# Patient Record
Sex: Male | Born: 1955 | Race: White | Hispanic: No | Marital: Married | Smoking: Never smoker
Health system: Southern US, Community
[De-identification: ages and names within clinical notes are randomized; demographics above are authoritative.]

## PROBLEM LIST (undated history)

## (undated) DIAGNOSIS — I82409 Acute embolism and thrombosis of unspecified deep veins of unspecified lower extremity: Secondary | ICD-10-CM

## (undated) DIAGNOSIS — E079 Disorder of thyroid, unspecified: Secondary | ICD-10-CM

## (undated) DIAGNOSIS — I2510 Atherosclerotic heart disease of native coronary artery without angina pectoris: Secondary | ICD-10-CM

## (undated) DIAGNOSIS — N529 Male erectile dysfunction, unspecified: Secondary | ICD-10-CM

## (undated) HISTORY — PX: NASAL SINUS SURGERY: SHX719

---

## 2004-11-27 ENCOUNTER — Ambulatory Visit: Payer: Self-pay | Admitting: Unknown Physician Specialty

## 2009-10-08 ENCOUNTER — Ambulatory Visit: Payer: Self-pay | Admitting: Unknown Physician Specialty

## 2010-04-02 ENCOUNTER — Ambulatory Visit: Payer: Self-pay | Admitting: Internal Medicine

## 2010-04-10 ENCOUNTER — Ambulatory Visit: Payer: Self-pay | Admitting: General Practice

## 2010-04-17 ENCOUNTER — Ambulatory Visit: Payer: Self-pay | Admitting: Urology

## 2010-04-25 ENCOUNTER — Ambulatory Visit: Payer: Self-pay | Admitting: General Practice

## 2010-08-21 ENCOUNTER — Ambulatory Visit: Payer: Self-pay | Admitting: Urology

## 2011-09-28 ENCOUNTER — Ambulatory Visit: Payer: Self-pay | Admitting: Urology

## 2017-01-08 ENCOUNTER — Other Ambulatory Visit: Payer: Self-pay | Admitting: Physician Assistant

## 2017-01-08 ENCOUNTER — Ambulatory Visit
Admission: RE | Admit: 2017-01-08 | Discharge: 2017-01-08 | Disposition: A | Discharge status: Home / Self Care | Payer: Managed Care, Other (non HMO) | Source: Ambulatory Visit | Attending: Physician Assistant | Admitting: Physician Assistant

## 2017-01-08 DIAGNOSIS — M79605 Pain in left leg: Secondary | ICD-10-CM

## 2017-01-08 DIAGNOSIS — I82812 Embolism and thrombosis of superficial veins of left lower extremities: Secondary | ICD-10-CM | POA: Diagnosis not present

## 2017-07-16 ENCOUNTER — Other Ambulatory Visit: Payer: Self-pay | Admitting: Physician Assistant

## 2017-07-16 ENCOUNTER — Ambulatory Visit
Admission: RE | Admit: 2017-07-16 | Discharge: 2017-07-16 | Disposition: A | Discharge status: Home / Self Care | Payer: Managed Care, Other (non HMO) | Source: Ambulatory Visit | Attending: Physician Assistant | Admitting: Physician Assistant

## 2017-07-16 ENCOUNTER — Emergency Department
Admission: EM | Admit: 2017-07-16 | Discharge: 2017-07-16 | Disposition: A | Discharge status: Home / Self Care | Payer: Managed Care, Other (non HMO) | Attending: Emergency Medicine | Admitting: Emergency Medicine

## 2017-07-16 DIAGNOSIS — I82412 Acute embolism and thrombosis of left femoral vein: Secondary | ICD-10-CM

## 2017-07-16 DIAGNOSIS — M7989 Other specified soft tissue disorders: Secondary | ICD-10-CM

## 2017-07-16 DIAGNOSIS — I82432 Acute embolism and thrombosis of left popliteal vein: Secondary | ICD-10-CM

## 2017-07-16 DIAGNOSIS — Z86718 Personal history of other venous thrombosis and embolism: Secondary | ICD-10-CM

## 2017-07-16 DIAGNOSIS — I82442 Acute embolism and thrombosis of left tibial vein: Secondary | ICD-10-CM

## 2017-07-16 DIAGNOSIS — R2242 Localized swelling, mass and lump, left lower limb: Secondary | ICD-10-CM | POA: Diagnosis present

## 2017-07-16 DIAGNOSIS — I82812 Embolism and thrombosis of superficial veins of left lower extremities: Secondary | ICD-10-CM

## 2017-07-16 HISTORY — DX: Acute embolism and thrombosis of unspecified deep veins of unspecified lower extremity: I82.409

## 2017-07-16 LAB — BASIC METABOLIC PANEL
Anion gap: 10 (ref 5–15)
BUN: 18 mg/dL (ref 6–20)
CALCIUM: 9 mg/dL (ref 8.9–10.3)
CHLORIDE: 103 mmol/L (ref 101–111)
CO2: 22 mmol/L (ref 22–32)
CREATININE: 0.87 mg/dL (ref 0.61–1.24)
GFR calc Af Amer: >60 mL/min (ref >60)
GFR calc non Af Amer: >60 mL/min (ref >60)
Glucose, Bld: 111 mg/dL — ABNORMAL HIGH (ref 65–99)
Potassium: 3.7 mmol/L (ref 3.5–5.1)
SODIUM: 135 mmol/L (ref 135–145)

## 2017-07-16 LAB — PROTIME-INR
INR: 1.09
Prothrombin Time: 14 seconds (ref 11.4–15.2)

## 2017-07-16 LAB — APTT: aPTT: 25 seconds (ref 24–36)

## 2017-07-16 LAB — CBC
HEMATOCRIT: 38 % — AB (ref 40.0–52.0)
HEMOGLOBIN: 13.3 g/dL (ref 13.0–18.0)
MCH: 30.6 pg (ref 26.0–34.0)
MCHC: 34.9 g/dL (ref 32.0–36.0)
MCV: 87.7 fL (ref 80.0–100.0)
Platelets: 207 10*3/uL (ref 150–440)
RBC: 4.34 MIL/uL — ABNORMAL LOW (ref 4.40–5.90)
RDW: 13.3 % (ref 11.5–14.5)
WBC: 9.2 10*3/uL (ref 3.8–10.6)

## 2017-07-16 MED ORDER — ELIQUIS 5 MG VTE STARTER PACK: ORAL_TABLET | ORAL | 0 refills | Status: DC

## 2017-07-16 MED ORDER — APIXABAN 5 MG PO TABS
10.0000 mg | ORAL_TABLET | Freq: Once | ORAL | Status: AC
  Administered 2017-07-16: 10 mg via ORAL
  Filled 2017-07-16: qty 2

## 2017-07-16 MED ORDER — ELIQUIS 5 MG VTE STARTER PACK: ORAL_TABLET | ORAL | 0 refills | Status: AC

## 2017-07-16 MED ORDER — APIXABAN 5 MG PO TABS: 10.0000 mg | ORAL_TABLET | Freq: Two times a day (BID) | ORAL | 0 refills | Status: DC

## 2017-07-16 NOTE — Discharge Instructions (Signed)
Please read through these written instructions thoroughly. Make an appointment with your primary care physician.  Return to the emergency department if you develop chest pain, shortness of breath, fever, bloody or black stools, lightheadedness or fainting, or any other symptoms concerning to you.

## 2017-07-16 NOTE — ED Notes (Signed)
FIRST NURSE NOTE:  Here from radiology, pt has occluded popliteal vein. Pt placed in wheelchair on arrival, denies any chest pain or shortness of breath.

## 2017-07-16 NOTE — ED Provider Notes (Signed)
[facility] Emergency Department Provider Note  ____________________________________________  Time seen: Approximately 6:56 PM  I have reviewed the triage vital signs and the nursing notes.   HISTORY  Chief Complaint DVT    HPI Hayden Fletcher is a 61 y.o. male with a history of DVT on full dose aspirin presenting with left lower extremity swelling, erythema, pain,and an outside DVT study positive for blood clot. The patient denies any chest pain, shortness of breath, lightheadedness or syncope.  He has not had any black, tarry or bloody stools.  No hx of GI bleed.   Past Medical History:  Diagnosis Date  . DVT (deep venous thrombosis) ([place])     There are no active problems to display for this patient.   Past Surgical History:  Procedure Laterality Date  . NASAL SINUS SURGERY        Allergies Patient has no known allergies.  History reviewed. No pertinent family history.  Social History Social History  Substance Use Topics  . Smoking status: Never Smoker  . Smokeless tobacco: Not on file  . Alcohol use Yes    Review of Systems Constitutional: No fever/chills. No lightheadedness or syncope. ENT: No congestion or rhinorrhea. Cardiovascular: Denies chest pain. Denies palpitations. Respiratory: Denies shortness of breath.  No cough. Gastrointestinal: No abdominal pain.  No nausea, no vomiting.  No diarrhea.  No constipation. No black tarry or bloody stools. Genitourinary: No hematuria. Musculoskeletal: Negative for back pain. Skin: Negative for rash. Neurological: Negative for headaches. No focal numbness, tingling or weakness.  Hematological/Lymphatic: positive left lower extremity DVT.    ____________________________________________   PHYSICAL EXAM:  VITAL SIGNS: ED Triage Vitals  Enc Vitals Group     BP 07/16/17 1814 137/82     Pulse Rate 07/16/17 1814 95     Resp 07/16/17 1814 18     Temp 07/16/17 1814 99.3 F (37.4 C)   Temp Source 07/16/17 1814 Oral     SpO2 07/16/17 1814 97 %     Weight 07/16/17 1817 210 lb (95.3 kg)     Height 07/16/17 1817 5\' 10"  (1.778 m)     Head Circumference --      Peak Flow --      Pain Score 07/16/17 1813 3     Pain Loc --      Pain Edu? --      Excl. in [place]? --     Constitutional: Alert and oriented. Well appearing and in no acute distress. Answers questions appropriately. Eyes: Conjunctivae are normal.  EOMI. No scleral icterus. Head: Atraumatic. Nose: No congestion/rhinnorhea. Mouth/Throat: Mucous membranes are moist.  Neck: No stridor.  Supple.   Cardiovascular: Normal rate, regular rhythm. No murmurs, rubs or gallops.  Respiratory: Normal respiratory effort.  No accessory muscle use or retractions. Lungs CTAB.  No wheezes, rales or ronchi. Musculoskeletal: Positive right lower extremity swelling with erythema.  Normal DP and PT pulse.  No calf ttp or palpable cords.  5/5 dorsiflexion and plantar flexion. Neurologic:  A&Ox3.  Speech is clear.  Face and smile are symmetric.  EOMI.  Moves all extremities well. Skin:  Skin is warm, dry and intact. No rash noted. Psychiatric: Mood and affect are normal. Speech and behavior are normal.  Normal judgement.  ____________________________________________   LABS (all labs ordered are listed, but only abnormal results are displayed)  Labs Reviewed  CBC - Abnormal; Notable for the following:       Result Value   RBC 4.34 (*)  HCT 38.0 (*)    All other components within normal limits  BASIC METABOLIC PANEL - Abnormal; Notable for the following:    Glucose, Bld 111 (*)    All other components within normal limits  PROTIME-INR  APTT   ____________________________________________  EKG  Not indicated ____________________________________________  RADIOLOGY  US Venous Img Lower Unilateral Left  Result Date: 07/16/2017 CLINICAL DATA:  Initial evaluation for acute left leg swelling, history of prior DVT. EXAM: Left  LOWER EXTREMITY VENOUS DOPPLER ULTRASOUND TECHNIQUE: Gray-scale sonography with graded compression, as well as color Doppler and duplex ultrasound were performed to evaluate the lower extremity deep venous systems from the level of the common femoral vein and including the common femoral, femoral, profunda femoral, popliteal and calf veins including the posterior tibial, peroneal and gastrocnemius veins when visible. The superficial great saphenous vein was also interrogated. Spectral Doppler was utilized to evaluate flow at rest and with distal augmentation maneuvers in the common femoral, femoral and popliteal veins. COMPARISON:  Prior ultrasound from 01/08/2017. FINDINGS: Contralateral Common Femoral Vein: Respiratory phasicity is normal and symmetric with the symptomatic side. No evidence of thrombus. Normal compressibility. Common Femoral Vein: No evidence of thrombus. Normal compressibility, respiratory phasicity and response to augmentation. Saphenofemoral Junction: No evidence of thrombus. Normal compressibility and flow on color Doppler imaging. Profunda Femoral Vein: No evidence of thrombus. Normal compressibility and flow on color Doppler imaging. Femoral Vein: Nonocclusive thrombus within the mid and distal left femoral vein, extending distally into the popliteal vein. Popliteal Vein: Occlusive thrombus present within the left popliteal vein. Loss of normal compressibility. Calf Veins: Occlusive thrombus within the left posterior tibial and peroneal veins, with loss of normal compressibility. Superficial Great Saphenous Vein: Nonocclusive thrombus present within mean superficial great saphenous vein extending from the mid thigh to the knee. Venous Reflux:  None. Other Findings:  None. IMPRESSION: 1. Occlusive and nonocclusive DVT extending from the mid - distal left femoral vein into the popliteal vein, as well as the posterior tibial and peroneal veins. 2. Additional nonocclusive clot within the left  greater saphenous vein extending from the mid thigh to knee. These results will be called to the ordering clinician or representative by the Radiologist Assistant, and communication documented in the PACS or zVision Dashboard. Electronically Signed   By: Jeannine Boga M.D.   On: 07/16/2017 17:13    ____________________________________________   PROCEDURES  Procedure(s) performed: None  Procedures  Critical Care performed: No ____________________________________________   INITIAL IMPRESSION / ASSESSMENT AND PLAN / ED COURSE  Pertinent labs & imaging results that were available during my care of the patient were reviewed by me and considered in my medical decision making (see chart for details).  62 y.o. male with a history of DVT currently on full aspirin, presenting with left lower extremity swelling, erythema, and positive DVT study.  The patient does not have any significant risk factors for bleeding. I will plan to start him on  Eliquis with the first dose here. I have extensively discussed risks and benefits of anticoagulation.  At this time, there are no signs or symptoms of PE. Plan discharge. Return precautions were discussed.  ____________________________________________  FINAL CLINICAL IMPRESSION(S) / ED DIAGNOSES  Final diagnoses:  Acute deep vein thrombosis (DVT) of femoral vein of left lower extremity (HCC)         NEW MEDICATIONS STARTED DURING THIS VISIT:  Current Discharge Medication List    START taking these medications   Details  ELIQUIS STARTER PACK (ELIQUIS  STARTER PACK) 5 MG TABS Take as directed on package: start with two-5mg  tablets twice daily for 7 days. On day 8, switch to one-5mg  tablet twice daily. Qty: 1 each, Refills: 0          Eula Listen, MD 07/16/17 1940

## 2017-07-16 NOTE — ED Triage Notes (Signed)
Pt arrives to ED with positive [place] for DVT in L leg. Pt had DVT in April, started on aspirin. Denies any other blood thinners. Denies CP, SOB. Pt is alert, oriented, played golf today. L leg has some swelling. Both legs warm. No redness noted at this time.

## 2017-07-16 NOTE — ED Notes (Signed)
Pt alert and oriented X4, active, cooperative, pt in NAD. RR even and unlabored, color WNL.  Discharge and followup instructions reviewed. Pt informed to return if any life threatening symptoms occur.   

## 2017-08-17 ENCOUNTER — Emergency Department
Admission: EM | Admit: 2017-08-17 | Discharge: 2017-08-17 | Disposition: A | Discharge status: Home / Self Care | Payer: Managed Care, Other (non HMO) | Attending: Emergency Medicine | Admitting: Emergency Medicine

## 2017-08-17 ENCOUNTER — Emergency Department: Payer: Managed Care, Other (non HMO)

## 2017-08-17 ENCOUNTER — Encounter: Payer: Self-pay | Admitting: *Deleted

## 2017-08-17 DIAGNOSIS — Z7901 Long term (current) use of anticoagulants: Secondary | ICD-10-CM | POA: Diagnosis not present

## 2017-08-17 DIAGNOSIS — Z79899 Other long term (current) drug therapy: Secondary | ICD-10-CM | POA: Insufficient documentation

## 2017-08-17 DIAGNOSIS — H53462 Homonymous bilateral field defects, left side: Secondary | ICD-10-CM | POA: Insufficient documentation

## 2017-08-17 DIAGNOSIS — H538 Other visual disturbances: Secondary | ICD-10-CM | POA: Diagnosis present

## 2017-08-17 DIAGNOSIS — I2510 Atherosclerotic heart disease of native coronary artery without angina pectoris: Secondary | ICD-10-CM | POA: Diagnosis not present

## 2017-08-17 DIAGNOSIS — D444 Neoplasm of uncertain behavior of craniopharyngeal duct: Secondary | ICD-10-CM | POA: Insufficient documentation

## 2017-08-17 HISTORY — DX: Atherosclerotic heart disease of native coronary artery without angina pectoris: I25.10

## 2017-08-17 HISTORY — DX: Disorder of thyroid, unspecified: E07.9

## 2017-08-17 LAB — COMPREHENSIVE METABOLIC PANEL
ALK PHOS: 43 U/L (ref 38–126)
ALT: 32 U/L (ref 17–63)
AST: 32 U/L (ref 15–41)
Albumin: 4.2 g/dL (ref 3.5–5.0)
Anion gap: 10 (ref 5–15)
BILIRUBIN TOTAL: 0.9 mg/dL (ref 0.3–1.2)
BUN: 19 mg/dL (ref 6–20)
CO2: 25 mmol/L (ref 22–32)
Calcium: 9.1 mg/dL (ref 8.9–10.3)
Chloride: 102 mmol/L (ref 101–111)
Creatinine, Ser: 1.05 mg/dL (ref 0.61–1.24)
GFR calc Af Amer: >60 mL/min (ref >60)
GFR calc non Af Amer: >60 mL/min (ref >60)
GLUCOSE: 98 mg/dL (ref 65–99)
POTASSIUM: 4.2 mmol/L (ref 3.5–5.1)
Sodium: 137 mmol/L (ref 135–145)
TOTAL PROTEIN: 8.3 g/dL — AB (ref 6.5–8.1)

## 2017-08-17 LAB — DIFFERENTIAL
BASOS ABS: 0 10*3/uL (ref 0–0.1)
Basophils Relative: 1 %
EOS ABS: 0.1 10*3/uL (ref 0–0.7)
Eosinophils Relative: 1 %
LYMPHS ABS: 2 10*3/uL (ref 1.0–3.6)
LYMPHS PCT: 28 %
Monocytes Absolute: 0.6 10*3/uL (ref 0.2–1.0)
Monocytes Relative: 8 %
NEUTROS ABS: 4.5 10*3/uL (ref 1.4–6.5)
NEUTROS PCT: 62 %

## 2017-08-17 LAB — CBC
HCT: 43 % (ref 40.0–52.0)
Hemoglobin: 14.5 g/dL (ref 13.0–18.0)
MCH: 29.7 pg (ref 26.0–34.0)
MCHC: 33.7 g/dL (ref 32.0–36.0)
MCV: 88.1 fL (ref 80.0–100.0)
Platelets: 195 10*3/uL (ref 150–440)
RBC: 4.88 MIL/uL (ref 4.40–5.90)
RDW: 14.2 % (ref 11.5–14.5)
WBC: 7.2 10*3/uL (ref 3.8–10.6)

## 2017-08-17 LAB — TROPONIN I: Troponin I: <0.03 ng/mL (ref <0.03)

## 2017-08-17 LAB — T4, FREE: Free T4: 0.89 ng/dL (ref 0.61–1.12)

## 2017-08-17 LAB — TSH: TSH: 0.38 u[IU]/mL (ref 0.350–4.500)

## 2017-08-17 LAB — PROTIME-INR
INR: 1.09
Prothrombin Time: 14 seconds (ref 11.4–15.2)

## 2017-08-17 LAB — APTT: aPTT: 24 seconds (ref 24–36)

## 2017-08-17 MED ORDER — ALUM & MAG HYDROXIDE-SIMETH 200-200-20 MG/5ML PO SUSP
30.0000 mL | Freq: Once | ORAL | Status: AC
  Administered 2017-08-17: 30 mL via ORAL

## 2017-08-17 MED ORDER — ALUM & MAG HYDROXIDE-SIMETH 200-200-20 MG/5ML PO SUSP
ORAL | Status: AC
  Filled 2017-08-17: qty 30

## 2017-08-17 MED ORDER — GADOBENATE DIMEGLUMINE 529 MG/ML IV SOLN
10.0000 mL | Freq: Once | INTRAVENOUS | Status: AC | PRN
  Administered 2017-08-17: 10 mL via INTRAVENOUS

## 2017-08-17 NOTE — ED Notes (Addendum)
Pt reports that he was having trouble with his vision for the last month - he went to eye doctor and got glasses today and he reports that his vision has gotten worse since July - the eye doctor stated that they thought he may have had a stroke and that was the reason for poor vision - they sent him to er for eval - pt denies any new issues today - denies any weakness - denies any change in speech - no difficulty ambulating - no difficulty swallowing - no facial droop noted

## 2017-08-17 NOTE — ED Notes (Signed)
Pt c/o heartburn - Dr Karma Greaser notified - see new orders

## 2017-08-17 NOTE — ED Provider Notes (Signed)
[facility] Emergency Department Provider Note  ____________________________________________   First MD Initiated Contact with Patient 08/17/17 1410     (approximate)  I have reviewed the triage vital signs and the nursing notes.   HISTORY  Chief Complaint Visual Field Change    HPI Hayden Fletcher is a 61 y.o. male who presents for evaluation of gradually worsening visual changes for at least a month and a half but possibly several months longer than that.  He was sent directly from his eye care provider, Silvano Bilis [phone]) because the patient went for an eye exam today and it was discovered that he has left hemianopsia.  He was sent to the ED for imaging given the concern for ischemic disease vs mass effect.  The patient reports that he first noticed some visual deficits about 3 months ago but he and his wife both state that the loss of vision has gotten significantly worse over the last month and a half.  He got a new pair of glasses which they thought would help, but he could barely see out of them. he went to the ophthalmologist today who did an extensive evaluation and was concerned to find a profound left visual field deficit in both eyes (left hemianopsia).  He sent him to the ED for further evaluation.  He describes the symptoms as severe and gradual in onset.  Nothing in particular makes the patient's symptoms better nor worse.    He denies nausea, vomiting, chest pain, shortness of breath, abdominal pain, and dysuria.  He does not feel he has had any changes in gait although his wife thinks that he is ambulating more slowly than usual.  He denies any numbness or weakness in any of his extremities.  His wife feels that he has had some increasing confusion, but the patient jokingly states that she has said that for 20 years.   Past Medical History:  Diagnosis Date  . Coronary artery disease   . DVT (deep venous thrombosis) ([place])   . Thyroid  disease     There are no active problems to display for this patient.   Past Surgical History:  Procedure Laterality Date  . NASAL SINUS SURGERY      Prior to Admission medications   Medication Sig Start Date End Date Taking? Authorizing Provider  Cholecalciferol (VITAMIN D3 PO) Take 1 tablet by mouth daily.   Yes [provider]  Cyanocobalamin (VITAMIN B-12 PO) Take 1 tablet by mouth daily.   Yes [provider]  ELIQUIS STARTER PACK (ELIQUIS STARTER PACK) 5 MG TABS Take as directed on package: start with two-5mg  tablets twice daily for 7 days. On day 8, switch to one-5mg  tablet twice daily. Patient taking differently: Take 5 mg by mouth 2 (two) times daily.  07/16/17  Yes Eula Listen, MD  fluticasone (FLONASE) 50 MCG/ACT nasal spray Place 1 spray into both nostrils daily.   Yes [provider]  furosemide (LASIX) 20 MG tablet Take 20 mg by mouth daily. 07/20/17  Yes [provider]  levothyroxine (SYNTHROID, LEVOTHROID) 125 MCG tablet Take 125 mcg by mouth daily before breakfast. 05/10/17  Yes [provider]  Multiple Vitamin (MULTIVITAMIN WITH MINERALS) TABS tablet Take 1 tablet by mouth daily.   Yes [provider]  acetaminophen (TYLENOL) 500 MG tablet Take 500 mg by mouth every 6 (six) hours as needed. For pain.    [provider]  omeprazole (PRILOSEC) 20 MG capsule Take 20 mg  by mouth daily as needed. For acid reflux/heartburn. 07/16/17   [provider]  sildenafil (VIAGRA) 100 MG tablet Take 100 mg by mouth daily as needed. For ED. 06/21/17   [provider]  terbinafine (LAMISIL) 250 MG tablet Take 250 mg by mouth See admin instructions. Take 1 tablet (250 mg) by mouth for 5 days in the month. 05/18/17   [provider]    Allergies Patient has no known allergies.  No family history on file.  Social History Social History  Substance Use Topics  . Smoking status: Never Smoker    . Smokeless tobacco: Not on file  . Alcohol use Yes    Review of Systems Constitutional: No fever/chills Eyes: increasing vision deficits over the last several months most notable in the left visual fields ENT: No sore throat. Cardiovascular: Denies chest pain. Respiratory: Denies shortness of breath. Gastrointestinal: No abdominal pain.  No nausea, no vomiting.  No diarrhea.  No constipation. Genitourinary: Negative for dysuria. Musculoskeletal: Negative for neck pain.  Negative for back pain. Integumentary: Negative for rash. Neurological: Negative for headaches, focal weakness or numbness.   ____________________________________________   PHYSICAL EXAM:  VITAL SIGNS: ED Triage Vitals  Enc Vitals Group     BP 08/17/17 1313 130/85     Pulse Rate 08/17/17 1313 67     Resp 08/17/17 1313 20     Temp 08/17/17 1313 98.9 F (37.2 C)     Temp Source 08/17/17 1313 Oral     SpO2 08/17/17 1313 98 %     Weight 08/17/17 1314 93 kg (205 lb)     Height 08/17/17 1314 1.778 m (5\' 10" )     Head Circumference --      Peak Flow --      Pain Score 08/17/17 1343 0     Pain Loc --      Pain Edu? --      Excl. in [place]? --     Constitutional: Alert and oriented. Well appearing and in no acute distress. Eyes: Conjunctivae are normal. slightly dilated pupils bilaterally with sluggish and minimal response to light Head: Atraumatic. Nose: No congestion/rhinnorhea. Mouth/Throat: Mucous membranes are moist. Cardiovascular: Normal rate, regular rhythm. Good peripheral circulation. Grossly normal heart sounds. Respiratory: Normal respiratory effort.  No retractions. Lungs CTAB. Gastrointestinal: Soft and nontender. No distention.  Musculoskeletal: No lower extremity tenderness nor edema. No gross deformities of extremities. Neurologic:  Left sided visual field deficits in both eyes.  Otherwise neurologically intact - normal speech and language. No gross focal neurologic deficits are appreciated.  strength is intact throughout. Skin:  Skin is warm, dry and intact. No rash noted. Psychiatric: Mood and affect are normal. Speech and behavior are normal.  ____________________________________________   LABS (all labs ordered are listed, but only abnormal results are displayed)  Labs Reviewed  COMPREHENSIVE METABOLIC PANEL - Abnormal; Notable for the following:       Result Value   Total Protein 8.3 (*)    All other components within normal limits  PROTIME-INR  APTT  CBC  DIFFERENTIAL  TROPONIN I  TSH  T4, FREE  T3, FREE  CBG MONITORING, ED   ____________________________________________  EKG  None - EKG not ordered by ED physician ____________________________________________  RADIOLOGY   Ct Head Wo Contrast  Result Date: 08/17/2017 CLINICAL DATA:  Chronic headache. Patient referred TUR by a ophthalmologist for vision changes. EXAM: CT HEAD WITHOUT CONTRAST TECHNIQUE: Contiguous axial images were obtained from the base of the skull  through the vertex without intravenous contrast. COMPARISON:  None. FINDINGS: Brain: Large low-density, high-density and partially calcified mass centered in the suprasellar and prepontine cisterns. No fatty components are seen. Mass measures up to 5 cm craniocaudal and 5 cm transverse. There is mass effect on the pons and basilar and contact with the supraclinoid ICA. The optic chiasm is not distinguishable due to mass effect. There is deformation of the third ventricular floor without hydrocephalus. Cerebral atrophy. Vascular: Tumor vessel contact as above. Skull: No acute or aggressive finding Sinuses/Orbits: Chronic right maxillary sinusitis with atelectasis and sclerotic wall thickening. Other: These results were called by telephone at the time of interpretation on 08/17/2017 at 2:01 pm to Dr. Carrie Mew , who verbally acknowledged these results. IMPRESSION: 5 cm suprasellar and prepontine cistern mass with cystic and calcified components,  appearance consistent with craniopharyngioma. There is marked displacement of the optic chiasm. There is mass effect on the pons and basilar. Electronically Signed   By: Monte Fantasia M.D.   On: 08/17/2017 14:04   Mr Brain W And Wo Contrast  Result Date: 08/17/2017 CLINICAL DATA:  Initial evaluation for acute visual field defect, found have sellar/suprasellar mass on prior CT. EXAM: MRI HEAD WITHOUT AND WITH CONTRAST TECHNIQUE: Multiplanar, multiecho pulse sequences of the brain and surrounding structures were obtained without and with intravenous contrast. CONTRAST:  8mL MULTIHANCE GADOBENATE DIMEGLUMINE 529 MG/ML IV SOLN COMPARISON:  Prior CT from earlier same day. FINDINGS: Brain: Previously identified heterogeneous mass centered at the suprasellar and prepontine cisterns again seen. Lesion is fairly well-circumscribed with lobulated contour, measuring 3.7 x 5.2 x 4.6 cm (AP by transverse by craniocaudad). Predominant cystic component seen on prior CT demonstrate intrinsic T1 signal intensity, likely reflecting proteinaceous material. Small layering fluid fluid level seen on sagittal sequence (series 12, image 12). No internal fat component seen on T2 fat sat sequence. Heterogeneous Lee enhancing sellar nodular component seen at the anterior and left lateral aspect of the lesion, measuring approximately 2.7 x 1.8 x 2.5 cm (series 11, image 71). This component demonstrates heterogeneous calcification on prior CT. Again, mass felt to be most consistent with a craniopharyngioma. Mass effect on the optic chiasm which is compressed superiorly. Mass effect also seen on the midbrain and pons posteriorly, greater on the right. The basilar artery approximates the posterior aspect of this lesion, with a cystic component of the mass invaginating posterior to the right P1 segment (series 8, image 23). Lesion also closely approximates the supraclinoid ICAs bilaterally (Series 8, image 20). Third ventricle is  compressed, although the cerebral aqueduct appears remain patent with no hydrocephalus or transependymal flow of CSF. Age-related cerebral atrophy. No other focal parenchymal signal abnormality. No evidence for acute infarct. No evidence for acute or chronic intracranial hemorrhage. No other mass lesion, mass effect, or midline shift. No extra-axial fluid collection. Vascular: Major intracranial vascular flow voids are well maintained. Skull and upper cervical spine: Craniocervical junction normal. Upper cervical spine within normal limits. Bone marrow signal intensity normal. No scalp soft tissue abnormality. Sinuses/Orbits: Globes and orbital soft tissues within normal limits. Other: Scattered polypoid mucosal thickening present within the ethmoidal air cells and right maxillary sinus, chronic in appearance. No mastoid effusion. Inner ear structures normal. IMPRESSION: 3.7 x 5.2 x 4.6 cm suprasellar and prepontine cistern mass with cystic and solid components, again felt to be most consistent with a craniopharyngioma. Mass effect on the optic chiasm and midbrain/pons, with close approximation of the supraclinoid ICAs and basilar artery as  above. Electronically Signed   By: Jeannine Boga M.D.   On: 08/17/2017 18:22    ____________________________________________   PROCEDURES  Critical Care performed: No   Procedure(s) performed:   Procedures   ____________________________________________   INITIAL IMPRESSION / ASSESSMENT AND PLAN / ED COURSE  As part of my medical decision making, I reviewed the following data within the [place] History obtained from family, Nursing notes reviewed and incorporated, Old chart reviewed, Discussed with radiologist, A consult was requested and obtained from this/these consultant(s) (neurosurgery and ophtho)    The patient's physical exam is very concerning given the left sided hemianopsia.   he had a noncontrast CT scan while he was  awaiting a room and it was notable for a 5 cm x 5 cm mass near the pituitary gland with significant mass effect on the optic chiasm.  I will call and speak with neurosurgery and discussed the case to determine the best next steps to take.  The symptoms of being gradual in onset and he is hemodynamically stable with no focal neurological deficits other than the visual changes.  I updated the patient and family.   Clinical Course as of Aug 18 2011  Tue Aug 17, 2017  1527 since the last note, I have spoken with the neurosurgeon, Dr. Lacinda Axon, twice by phone, and I updated the ophthalmologist by phone as well.  Dr. Lacinda Axon agreed that the patient needs close and urgent follow-up, but after we discussed the entire situation including the patient's heart catheterization scheduled in 2 days, he offered to help get the patient set up with an outpatient clinic appointment next week so that he can have the heart catheterization and be cleared from a cardiac perspective for neurosurgery.  I discussed the 3 options with the patient and his family (immediate transfer to the [facility] ED with no expectation of immediate surgery, discharge for travel by private vehicle to the [facility] ED, or close outpatient follow-up after the cardiac catheterization) and they all agreed that outpatient follow-up is the best option, and I agree as well.  Dr. Lacinda Axon said that it would be very helpful to facilitate follow-up, as well as to rule out any other condition that would qualify for emergent transfer and/or surgery, if we obtain MR brain with and without IV contrast (pituitary protocol), so I have ordered this study.  I am also ordering TSH, T3, and T4 given the location of the tumor by the pituitary gland.  Assuming no additional conditions are found requiring emergent transfer, the patient and his family are comfortable with discharge after the MRI and they will be contacted by Dr. Jonathon Jordan clinic for a follow-up visit next week.  [CF]  1853 MRI is  back and did not reveal any acute or emergent information of which we were not already aware.  I am providing a CD to the patient that has the CT and MRI. I updated the patient and answered questions for the patient and family.  I gave my usual and customary return precautions.     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 [facility])  Left homonymous hemianopsia     MEDICATIONS GIVEN DURING THIS VISIT:  Medications  gadobenate dimeglumine (MULTIHANCE) injection 10 mL (10 mLs Intravenous Contrast Given 08/17/17 1745)  alum & mag hydroxide-simeth (MAALOX/MYLANTA) 200-200-20 MG/5ML suspension 30 mL (30  mLs Oral Given 08/17/17 1830)     NEW OUTPATIENT MEDICATIONS STARTED DURING THIS VISIT:  Discharge Medication List as of 08/17/2017  7:01 PM      Discharge Medication List as of 08/17/2017  7:01 PM      Discharge Medication List as of 08/17/2017  7:01 PM       Note:  This document was prepared using Dragon voice recognition software and may include unintentional dictation errors.    Hinda Kehr, MD 08/17/17 2012

## 2017-08-17 NOTE — ED Triage Notes (Signed)
Pt sent by eye dorctor for visual field changes, pt reports these symptoms started 1 month ago, pt reports episodes of " fogginess", pt is scheduled for a cardiac cath on Thursday after failing an stress test, pt denies chest pain or shortness of breath

## 2017-08-17 NOTE — Discharge Instructions (Signed)
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  Although this is generally a benign tumor, it is quite concerning and your case given the size of the tumor and the fact that it is compromising your vision and pushing on your brain.  After we discussed the various options, we agreed that you should proceed with your cardiac catheterization on Thursday so that you can be "cleared" for neurosurgery.  [facility] will call you to schedule a follow-up appointment in their clinic, most likely for sometime next week.  If you develop severe an emergent symptoms and require immediate care, please call 911 to be transported to the nearest emergency department.  Alternatively, if you are certain you are safe to be transported by a family member in your own vehicle, you may consider (if you feel it is safe to do so) going directly to [facility] where they have neurosurgeons immediately available.  Please continue taking all your regular medications.

## 2017-08-18 ENCOUNTER — Other Ambulatory Visit: Payer: Self-pay | Admitting: Cardiology

## 2017-08-18 LAB — T3, FREE: T3 FREE: 2.5 pg/mL (ref 2.0–4.4)

## 2017-08-19 ENCOUNTER — Ambulatory Visit
Admission: RE | Admit: 2017-08-19 | Discharge: 2017-08-19 | Disposition: A | Discharge status: Home / Self Care | Payer: Managed Care, Other (non HMO) | Source: Ambulatory Visit | Attending: Cardiology | Admitting: Cardiology

## 2017-08-19 ENCOUNTER — Encounter
Admission: RE | Disposition: A | Discharge status: Home / Self Care | Payer: Self-pay | Source: Ambulatory Visit | Attending: Cardiology

## 2017-08-19 ENCOUNTER — Encounter: Payer: Self-pay | Admitting: Emergency Medicine

## 2017-08-19 DIAGNOSIS — Z79899 Other long term (current) drug therapy: Secondary | ICD-10-CM | POA: Diagnosis not present

## 2017-08-19 DIAGNOSIS — E039 Hypothyroidism, unspecified: Secondary | ICD-10-CM | POA: Insufficient documentation

## 2017-08-19 DIAGNOSIS — R9439 Abnormal result of other cardiovascular function study: Secondary | ICD-10-CM | POA: Diagnosis present

## 2017-08-19 DIAGNOSIS — Z7901 Long term (current) use of anticoagulants: Secondary | ICD-10-CM | POA: Insufficient documentation

## 2017-08-19 DIAGNOSIS — Z8249 Family history of ischemic heart disease and other diseases of the circulatory system: Secondary | ICD-10-CM | POA: Insufficient documentation

## 2017-08-19 DIAGNOSIS — Z87442 Personal history of urinary calculi: Secondary | ICD-10-CM | POA: Insufficient documentation

## 2017-08-19 DIAGNOSIS — R0602 Shortness of breath: Secondary | ICD-10-CM | POA: Diagnosis not present

## 2017-08-19 DIAGNOSIS — Z85828 Personal history of other malignant neoplasm of skin: Secondary | ICD-10-CM | POA: Insufficient documentation

## 2017-08-19 DIAGNOSIS — E559 Vitamin D deficiency, unspecified: Secondary | ICD-10-CM | POA: Diagnosis not present

## 2017-08-19 DIAGNOSIS — I82409 Acute embolism and thrombosis of unspecified deep veins of unspecified lower extremity: Secondary | ICD-10-CM | POA: Insufficient documentation

## 2017-08-19 DIAGNOSIS — R0789 Other chest pain: Secondary | ICD-10-CM | POA: Insufficient documentation

## 2017-08-19 HISTORY — PX: LEFT HEART CATH AND CORONARY ANGIOGRAPHY: CATH118249

## 2017-08-19 HISTORY — DX: Male erectile dysfunction, unspecified: N52.9

## 2017-08-19 SURGERY — LEFT HEART CATH AND CORONARY ANGIOGRAPHY
Anesthesia: Moderate Sedation | Laterality: Left

## 2017-08-19 MED ORDER — SODIUM CHLORIDE 0.9 % WEIGHT BASED INFUSION: 1.0000 mL/kg/h | INTRAVENOUS | Status: DC

## 2017-08-19 MED ORDER — ASPIRIN 81 MG PO CHEW
81.0000 mg | CHEWABLE_TABLET | ORAL | Status: AC
  Administered 2017-08-19: 81 mg via ORAL

## 2017-08-19 MED ORDER — SODIUM CHLORIDE 0.9% FLUSH: 3.0000 mL | Freq: Two times a day (BID) | INTRAVENOUS | Status: DC

## 2017-08-19 MED ORDER — HEPARIN (PORCINE) IN NACL 2-0.9 UNIT/ML-% IJ SOLN
INTRAMUSCULAR | Status: AC
  Filled 2017-08-19: qty 500

## 2017-08-19 MED ORDER — SODIUM CHLORIDE 0.9 % IV SOLN: 250.0000 mL | INTRAVENOUS | Status: DC | PRN

## 2017-08-19 MED ORDER — MIDAZOLAM HCL 2 MG/2ML IJ SOLN
INTRAMUSCULAR | Status: AC
  Filled 2017-08-19: qty 2

## 2017-08-19 MED ORDER — SODIUM CHLORIDE 0.9% FLUSH: 3.0000 mL | INTRAVENOUS | Status: DC | PRN

## 2017-08-19 MED ORDER — SODIUM CHLORIDE 0.9 % WEIGHT BASED INFUSION
3.0000 mL/kg/h | INTRAVENOUS | Status: AC
  Administered 2017-08-19: 3 mL/kg/h via INTRAVENOUS

## 2017-08-19 MED ORDER — ASPIRIN 81 MG PO CHEW
CHEWABLE_TABLET | ORAL | Status: AC
  Administered 2017-08-19: 81 mg via ORAL
  Filled 2017-08-19: qty 1

## 2017-08-19 MED ORDER — IOPAMIDOL (ISOVUE-300) INJECTION 61%
INTRAVENOUS | Status: DC | PRN
  Administered 2017-08-19: 95 mL via INTRA_ARTERIAL

## 2017-08-19 MED ORDER — MIDAZOLAM HCL 2 MG/2ML IJ SOLN
INTRAMUSCULAR | Status: DC | PRN
  Administered 2017-08-19: 1 mg via INTRAVENOUS

## 2017-08-19 MED ORDER — FENTANYL CITRATE (PF) 100 MCG/2ML IJ SOLN
INTRAMUSCULAR | Status: AC
  Filled 2017-08-19: qty 2

## 2017-08-19 MED ORDER — FENTANYL CITRATE (PF) 100 MCG/2ML IJ SOLN
INTRAMUSCULAR | Status: DC | PRN
  Administered 2017-08-19: 25 ug via INTRAVENOUS

## 2017-08-19 SURGICAL SUPPLY — 9 items
CATH 5FR JR4 DIAGNOSTIC (CATHETERS) ×3 IMPLANT
CATH INFINITI 5FR ANG PIGTAIL (CATHETERS) ×3 IMPLANT
CATH INFINITI 5FR JL4 (CATHETERS) ×3 IMPLANT
DEVICE CLOSURE MYNXGRIP 5F (Vascular Products) ×3 IMPLANT
KIT MANI 3VAL PERCEP (MISCELLANEOUS) ×3 IMPLANT
NEEDLE PERC 18GX7CM (NEEDLE) ×3 IMPLANT
PACK CARDIAC CATH (CUSTOM PROCEDURE TRAY) ×3 IMPLANT
SHEATH AVANTI 5FR X 11CM (SHEATH) ×3 IMPLANT
WIRE EMERALD 3MM-J .035X150CM (WIRE) ×3 IMPLANT

## 2017-08-19 NOTE — Discharge Instructions (Signed)

## 2017-08-19 NOTE — H&P (Signed)
Chief Complaint: Chief Complaint  Patient presents with  . Establish Care  abnormal stress echo  Date of Service: 08/06/2017 Date of Birth: Jul 08, 1956 PCP: Yevonne Pax, MD  History of Present Illness: Mr. Hayden Fletcher is a 61 y.o.male patient who presents for evaluation regarding an abnormal stress echo. Patient has had some dyspnea on exertion which is new for him. He has been quite active without any problems prior to this. He was recently diagnosed with a deep vein thrombosis and is been on Eliquis for approximately 3 weeks. He had no pulmonary embolus documented. Stress echo showed lateral ischemia. Patient has risk factors for heart disease including family history.  Past Medical and Surgical History  Past Medical History Past Medical History:  Diagnosis Date  . Acquired hypothyroidism 05/12/2016  . Basal cell carcinoma  . History of kidney stones  . Hx of adenomatous colonic polyps  . 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 Subclinical hypothyroidism  . Vitamin D deficiency   Past Surgical History He has a past surgical history that includes colonoscopy (10/08/2009, 11/27/2004); S/P sinus surgery (2004); Lithotripsy; Excision of Basal cell carcinoma on right side of face; excision basal cell carcinoma on neck (Right, 06/2015); and Colonoscopy (05/11/2017).   Medications and Allergies  Current Medications  Current Outpatient Prescriptions  Medication Sig Dispense Refill  . acetaminophen (TYLENOL) 500 MG tablet Take 500 mg by mouth continuously as needed for Pain.  . cetirizine (ZYRTEC) 10 mg capsule Take 10 mg by mouth once daily.  . cholecalciferol (VITAMIN D3) 2,000 unit capsule Take 2,000 Units by mouth once daily.   . cyanocobalamin (VITAMIN B12) 1000 MCG tablet Take 1,000 mcg by mouth once daily.  Marland Kitchen ELIQUIS 5 mg tablet TAKE 2 TABLETS BY MOUTH TWICE A DAY FOR 7 DAYS --THEN 1 TABLET TWICE A DAY 0  . FUROsemide (LASIX) 20 MG tablet Take 1 tablet (20 mg total) by mouth  once daily. 90 tablet 3  . glucosamine-chondr-cart-collag 125-100-40-10 mg Tab Take by mouth 2 (two) times daily.  Marland Kitchen ibuprofen (ADVIL,MOTRIN) 200 MG tablet Take 200 mg by mouth continuously as needed for Pain.  Marland Kitchen levothyroxine (SYNTHROID, LEVOTHROID) 125 MCG tablet Take 1 tablet (125 mcg total) by mouth once daily. Take on an empty stomach with a glass of water at least 30-60 minutes before breakfast. 90 tablet 3  . omeprazole (PRILOSEC) 20 MG DR capsule Take 1 capsule (20 mg total) by mouth once daily. 30 capsule 3  . sildenafil (VIAGRA) 100 MG tablet TAKE ONE TABLET BY MOUTH ONCE DAILY AS NEEDED 10 tablet 11   No current facility-administered medications for this visit.   Allergies: Patient has no known allergies.  Social and Family History  Social History reports that he has never smoked. He has never used smokeless tobacco. He reports that he drinks alcohol. He reports that he does not use drugs.  Family History Family History  Problem Relation Age of Onset  . Diabetes Mother  . Leukemia Mother  . Alzheimer's disease Father  . Diabetes Brother  died of diabetes  . Colon cancer Brother 60   Review of Systems  Review of Systems  Constitutional: Negative for chills, diaphoresis, fever, malaise/fatigue and weight loss.  HENT: Negative for congestion, ear discharge, hearing loss and tinnitus.  Eyes: Negative for blurred vision.  Respiratory: Negative for cough, hemoptysis, sputum production, shortness of breath and wheezing.  Cardiovascular: Negative for chest pain, palpitations, orthopnea, claudication, leg swelling and PND.  Gastrointestinal: Negative for abdominal pain, blood in  stool, constipation, diarrhea, heartburn, melena, nausea and vomiting.  Genitourinary: Negative for dysuria, frequency, hematuria and urgency.  Musculoskeletal: Positive for myalgias. Negative for back pain, falls and joint pain.  Skin: Negative for itching and rash.  Neurological: Negative for  dizziness, tingling, focal weakness, loss of consciousness, weakness and headaches.  Endo/Heme/Allergies: Negative for polydipsia. Does not bruise/bleed easily.  Psychiatric/Behavioral: Negative for depression, memory loss and substance abuse. The patient is not nervous/anxious.   Physical Examination   Vitals:BP 130/70  Pulse 64  Resp 12  Ht 177.8 cm (5\' 10" )  Wt 94.3 kg (208 lb)  BMI 29.84 kg/m  Ht:177.8 cm (5\' 10" ) Wt:94.3 kg (208 lb) FTD:DUKG surface area is 2.16 meters squared. Body mass index is 29.84 kg/m.  Wt Readings from Last 3 Encounters:  08/06/17 94.3 kg (208 lb)  07/29/17 98.5 kg (217 lb 3.2 oz)  07/23/17 98.5 kg (217 lb 4 oz)   BP Readings from Last 3 Encounters:  08/06/17 130/70  07/29/17 122/80  07/23/17 118/84   General appearance appears in no acute distress  Head Mouth and Eye exam Normocephalic, without obvious abnormality, atraumatic Dentition is good Eyes appear anicteric   Neck exam Thyroid: normal  Nodes: no obvious adenopathy  LUNGS Breath Sounds: Normal Percussion: Normal  CARDIOVASCULAR JVP CV wave: no HJR: no Elevation at 90 degrees: None Carotid Pulse: normal pulsation bilaterally Bruit: None Apex: apical impulse normal  Auscultation Rhythm: normal sinus rhythm S1: normal S2: normal Clicks: no Rub: no Murmurs: no murmurs  Gallop: None ABDOMEN Liver enlargement: no Pulsatile aorta: no Ascites: no Bruits: no  EXTREMITIES Clubbing: no Edema: trace to 1+ bilateral pedal edema Pulses: peripheral pulses symmetrical Femoral Bruits: no Amputation: no SKIN Rash: no Cyanosis: no Embolic phemonenon: no Bruising: no NEURO Alert and Oriented to person, place and time: yes Non focal: yes  PSYCH: Pt appears to have normal affect  LABS REVIEWED Last 3 CBC results: Lab Results  Component Value Date  WBC 7.5 07/29/2017  WBC 6.6 05/06/2017  WBC 7.4 05/12/2016   Lab Results  Component Value Date  HGB 14.1  07/29/2017  HGB 13.8 (L) 05/06/2017  HGB 13.9 (L) 03/05/2017   Lab Results  Component Value Date  HCT 42.8 07/29/2017  HCT 40.1 05/06/2017  HCT 41.2 05/12/2016   Lab Results  Component Value Date  PLT 239 07/29/2017  PLT 197 05/06/2017  PLT 225 05/12/2016   Lab Results  Component Value Date  CREATININE 0.9 07/29/2017  BUN 18 07/29/2017  NA 139 07/29/2017  K 4.2 07/29/2017  CL 104 07/29/2017  CO2 27.9 07/29/2017   Lab Results  Component Value Date  HGBA1C 6.0 (H) 05/06/2017   Lab Results  Component Value Date  HDL 41.0 05/06/2017  HDL 32.2 05/12/2016   Lab Results  Component Value Date  LDLCALC 119 05/06/2017   Lab Results  Component Value Date  TRIG 167 05/06/2017  TRIG 611 (H) 05/12/2016   Lab Results  Component Value Date  ALT 24 07/29/2017  AST 19 07/29/2017  ALKPHOS 40 07/29/2017   Lab Results  Component Value Date  TSH 0.182 (L) 07/29/2017   Diagnostic Studies Reviewed:  EKG EKG demonstrated normal sinus rhythm, nonspecific ST and T waves changes.  Assessment and Plan   61 y.o. male with  ICD-10-CM ICD-9-CM  1. Coronary artery disease involving native coronary artery of native heart with unstable angina pectoris (CMS-HCC)-had an abnormal stress echo showing lateral ischemia with some dyspnea. Had good exercise tolerance. Plan to proceed  with left heart cath to evaluate coronary anatomy to guide further therapy. Further recommendations after this is complete. I25.110 716.96 Basic Metabolic Panel (BMP)  789.3 CBC w/auto Differential (5 Part)  2. Acute deep vein thrombosis (DVT) of proximal vein of right lower extremity (CMS-HCC)-we will continue on Eliquis as per primary care recommendation. I82.4Y1 453.41   Return in about 4 weeks (around 09/03/2017).  These notes generated with voice recognition software. I apologize for typographical errors.  Sydnee Levans, MD     Pt seen and examined. No change from above.

## 2017-10-26 DEATH — deceased

## 2017-12-20 IMAGING — MR MR HEAD WO/W CM
15 of 16 series · 40 of 48 positions shown · IV contrast (multihance)
Comparison: Prior CT from earlier same day.

CLINICAL DATA: Initial evaluation for acute visual field defect,
found have sellar/suprasellar mass on prior CT.

EXAM:
MRI HEAD WITHOUT AND WITH CONTRAST
TECHNIQUE: Multiplanar, multiecho pulse sequences of the brain and surrounding
structures were obtained without and with intravenous contrast.
CONTRAST:  10mL MULTIHANCE GADOBENATE DIMEGLUMINE 529 MG/ML IV SOLN

[Series 2: T1 · sagittal · 5.0mm · 0.45mm/px · 3 of 27 slices shown (1 of 6)]
[im 1/27]
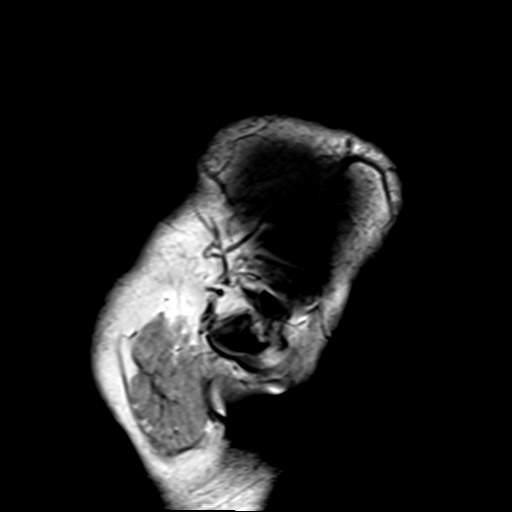
[im 14/27]
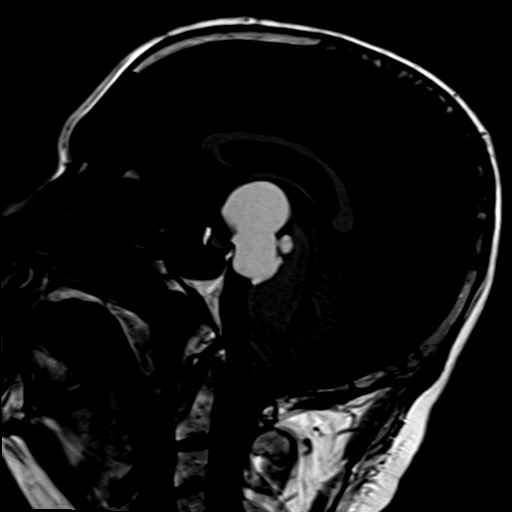
[im 27/27]
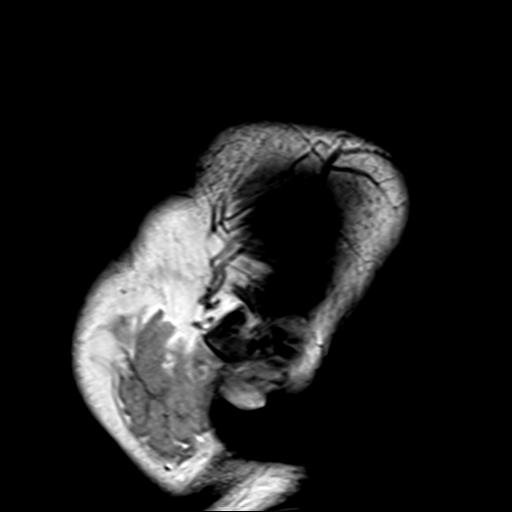

[Series 4: DWI · axial · 3.0mm · 1.80mm/px · z∈[-27,+113]mm · 3 of 55 slices shown]
[im 1/55]
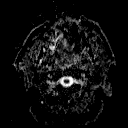
[im 28/55]
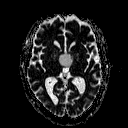
[im 55/55]
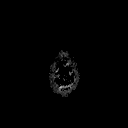

[Series 5: T2 · axial · 5.0mm · 0.60mm/px · z∈[-30,+116]mm · 2 of 27 slices shown (1 of 3)]
[im 1/27]
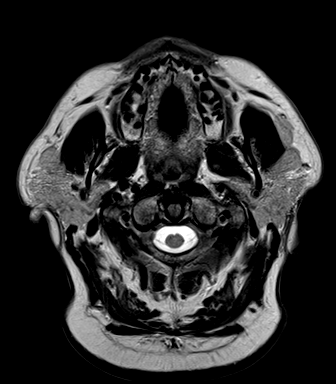
[im 27/27]
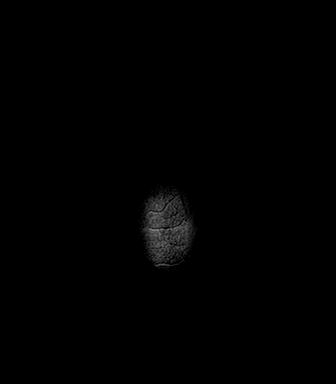

[Series 6: FLAIR · axial · 3.0mm · 0.60mm/px · z∈[-26,+109]mm · 3 of 53 slices shown]
[im 1/53]
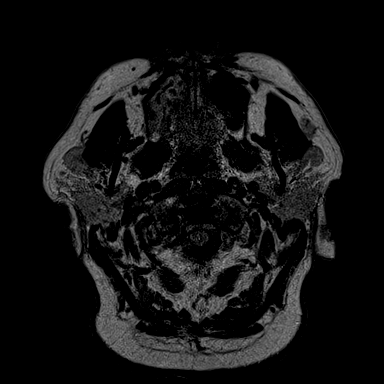
[im 27/53]
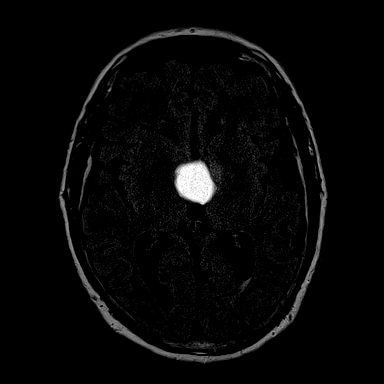
[im 53/53]
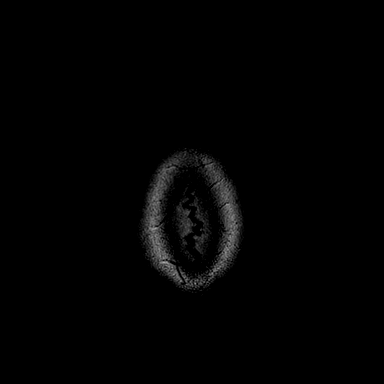

[Series 7: T2 · axial · 5.0mm · 0.60mm/px · z∈[-30,+116]mm · 2 of 27 slices shown (2 of 3)]
[im 1/27]
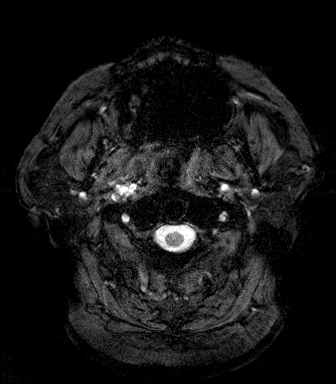
[im 27/27]
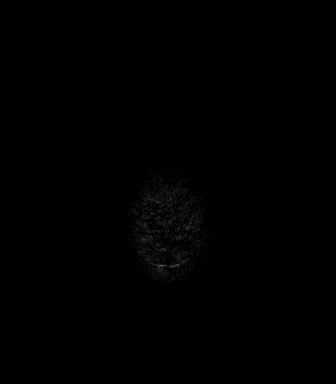

[Series 8: STIR · axial · 3.0mm · 0.45mm/px · z∈[-24,+43]mm · 2 of 53 slices shown]
[im 1/53]
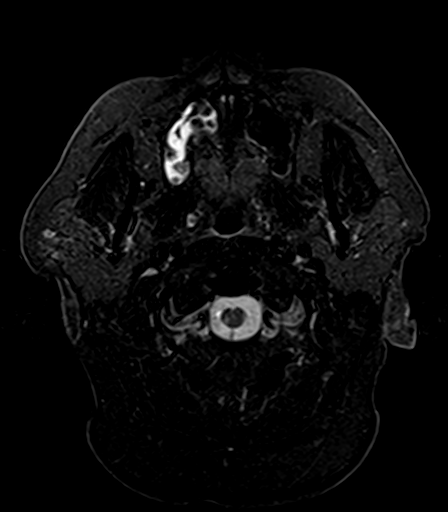
[im 27/53]
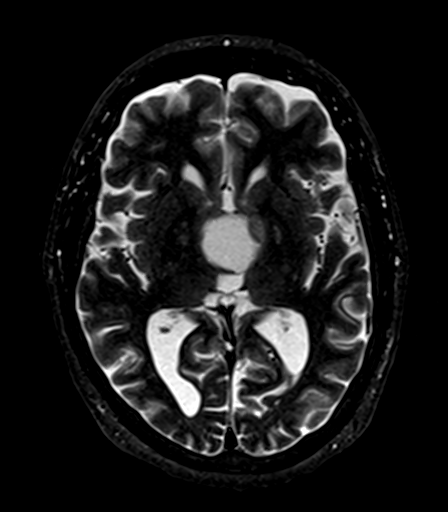

[Series 9: T1 · sagittal · 3.0mm · 0.62mm/px · 1 of 23 slices shown (2 of 6)]
[im 1/23]
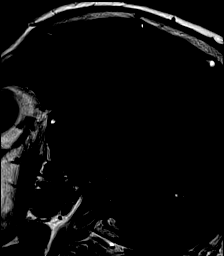

[Series 10: T1 · coronal · 3.0mm · 0.62mm/px · 1 of 23 slices shown (3 of 6)]
[im 1/23]
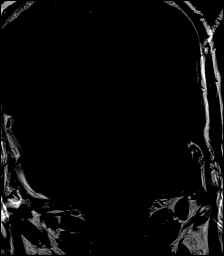

[Series 11: T1 · axial · 1.0mm · 1.00mm/px · z∈[-26,+126]mm · 8 of 176 slices shown (4 of 6)]
[im 1/176]
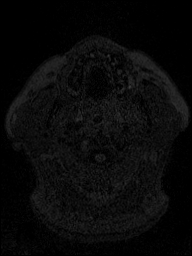
[im 20/176]
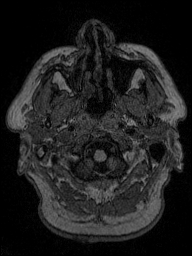
[im 59/176]
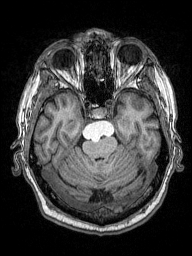
[im 78/176]
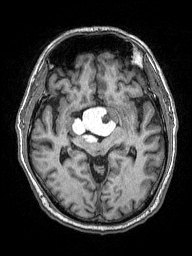
[im 98/176]
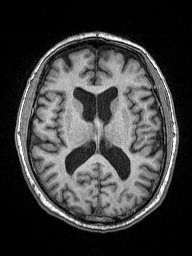
[im 117/176]
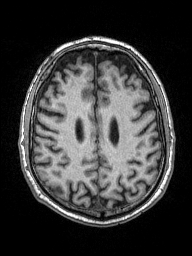
[im 156/176]
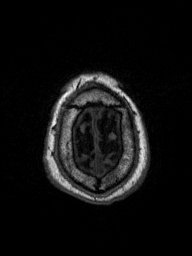
[im 176/176]
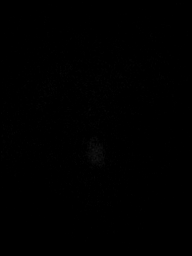

[Series 12: T2 · sagittal · 3.0mm · 0.57mm/px · 1 of 25 slices shown (3 of 3)]
[im 1/25]
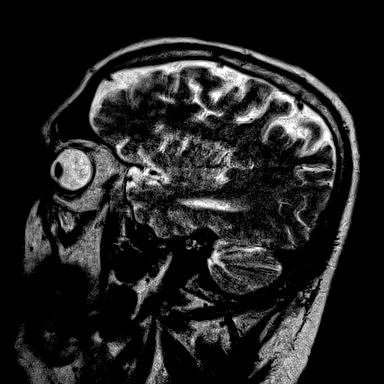

[Series 13: T1 · sagittal · 3.0mm · 0.62mm/px · 1 of 23 slices shown (5 of 6)]
[im 1/23]
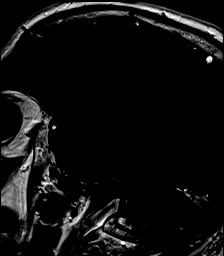

[Series 14: T1 · coronal · 3.0mm · 0.62mm/px · 1 of 23 slices shown (6 of 6)]
[im 1/23]
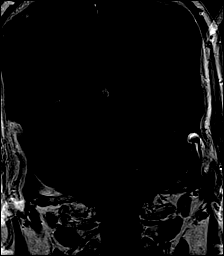

[Series 15: T1 post-contrast · axial · 1.0mm · 1.00mm/px · z∈[-26,+126]mm · 8 of 176 slices shown (1 of 3)]
[im 1/176]
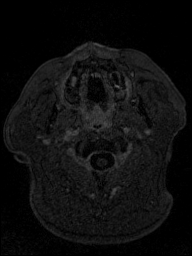
[im 20/176]
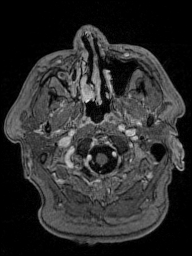
[im 59/176]
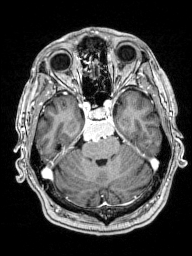
[im 78/176]
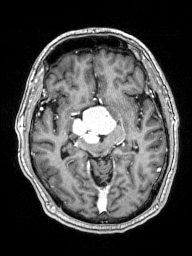
[im 98/176]
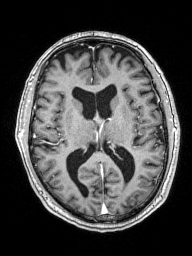
[im 117/176]
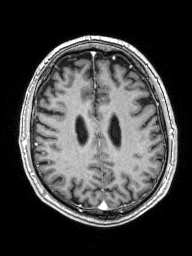
[im 156/176]
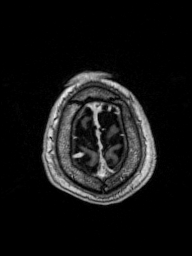
[im 176/176]
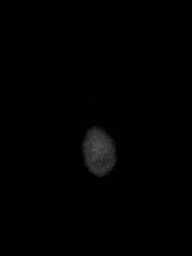

[Series 16: T1 post-contrast · coronal · 5.0mm · 0.43mm/px · 2 of 31 slices shown (2 of 3)]
[im 1/31]
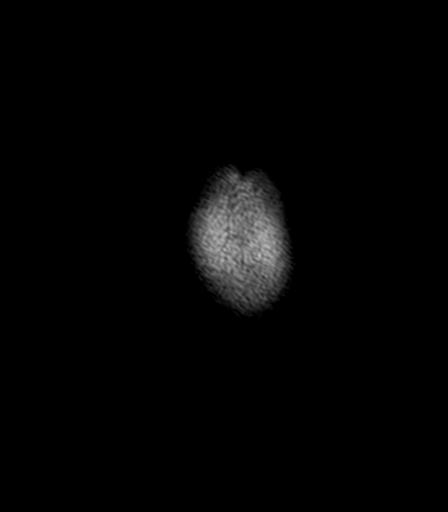
[im 31/31]
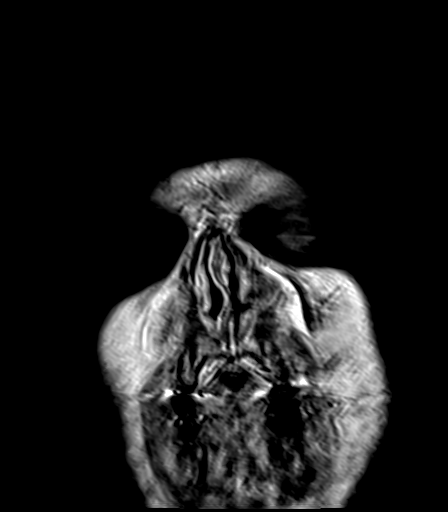

[Series 17: T1 post-contrast · sagittal · 5.0mm · 0.45mm/px · 2 of 27 slices shown (3 of 3)]
[im 1/27]
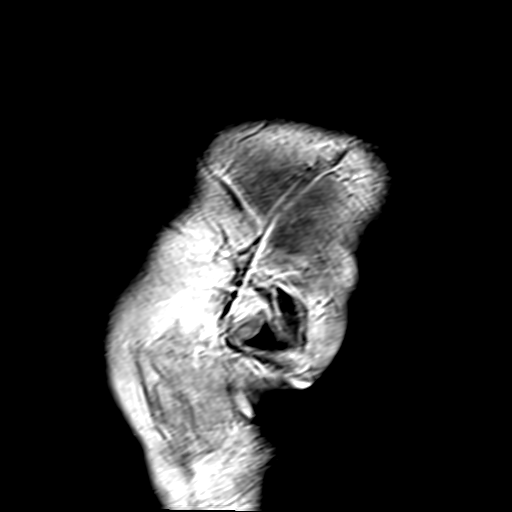
[im 27/27]
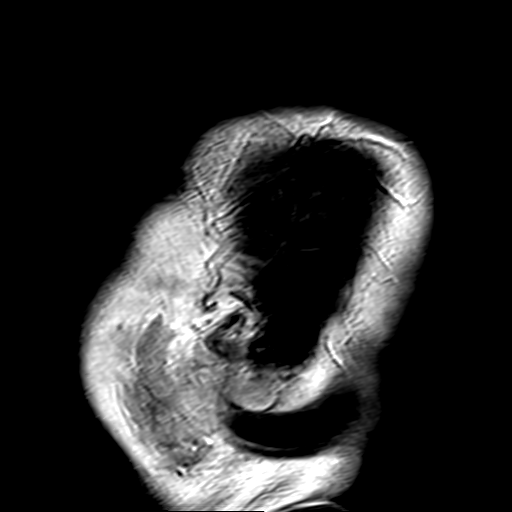

[40 of 48 positions shown; findings below may reference images not displayed]

FINDINGS: Brain: Previously identified heterogeneous mass centered at the
suprasellar and prepontine cisterns again seen. Lesion is fairly
well-circumscribed with lobulated contour, measuring 3.7 x 5.2 x
cm (AP by transverse by craniocaudad). Predominant cystic component
seen on prior CT demonstrate intrinsic T1 signal intensity, likely
reflecting proteinaceous material. Small layering fluid fluid level
seen on sagittal sequence (series 12, image 12). No internal fat
component seen on T2 fat sat sequence. Heterogeneous Nogu enhancing
sellar nodular component seen at the anterior and left lateral
aspect of the lesion, measuring approximately 2.7 x 1.8 x 2.5 cm
(series 11, image 71). This component demonstrates heterogeneous
calcification on prior CT. Again, mass felt to be most consistent
with a craniopharyngioma. Mass effect on the optic chiasm which is
compressed superiorly. Mass effect also seen on the midbrain and
pons posteriorly, greater on the right. The basilar artery
approximates the posterior aspect of this lesion, with a cystic
component of the mass invaginating posterior to the right P1 segment
(series 8, image 23). Lesion also closely approximates the
supraclinoid ICAs bilaterally (Series 8, image 20). Third ventricle
is compressed, although the cerebral aqueduct appears remain patent
with no hydrocephalus or transependymal flow of CSF.

Age-related cerebral atrophy. No other focal parenchymal signal
abnormality. No evidence for acute infarct. No evidence for acute or
chronic intracranial hemorrhage. No other mass lesion, mass effect,
or midline shift. No extra-axial fluid collection.

Vascular: Major intracranial vascular flow voids are well
maintained.

Skull and upper cervical spine: Craniocervical junction normal.
Upper cervical spine within normal limits. Bone marrow signal
intensity normal. No scalp soft tissue abnormality.

Sinuses/Orbits: Globes and orbital soft tissues within normal
limits.

Other: Scattered polypoid mucosal thickening present within the
ethmoidal air cells and right maxillary sinus, chronic in
appearance. No mastoid effusion. Inner ear structures normal.
IMPRESSION: 3.7 x 5.2 x 4.6 cm suprasellar and prepontine cistern mass with
cystic and solid components, again felt to be most consistent with a
craniopharyngioma. Mass effect on the optic chiasm and
midbrain/pons, with close approximation of the supraclinoid ICAs and
basilar artery as above.
